# Patient Record
Sex: Female | Born: 1975 | Race: White | Hispanic: No | Marital: Single | Smoking: Never smoker
Health system: Southern US, Community
[De-identification: ages and names within clinical notes are randomized; demographics above are authoritative.]

## PROBLEM LIST (undated history)

## (undated) DIAGNOSIS — G4730 Sleep apnea, unspecified: Secondary | ICD-10-CM

## (undated) DIAGNOSIS — M549 Dorsalgia, unspecified: Secondary | ICD-10-CM

## (undated) DIAGNOSIS — G8929 Other chronic pain: Secondary | ICD-10-CM

## (undated) DIAGNOSIS — E669 Obesity, unspecified: Secondary | ICD-10-CM

## (undated) DIAGNOSIS — J45909 Unspecified asthma, uncomplicated: Secondary | ICD-10-CM

## (undated) DIAGNOSIS — O039 Complete or unspecified spontaneous abortion without complication: Secondary | ICD-10-CM

## (undated) DIAGNOSIS — G43909 Migraine, unspecified, not intractable, without status migrainosus: Secondary | ICD-10-CM

---

## 2016-05-01 ENCOUNTER — Encounter: Payer: Self-pay | Admitting: Emergency Medicine

## 2016-05-01 ENCOUNTER — Emergency Department
Admission: EM | Admit: 2016-05-01 | Discharge: 2016-05-01 | Disposition: A | Discharge status: Home / Self Care | Payer: Medicaid Other | Attending: Emergency Medicine | Admitting: Emergency Medicine

## 2016-05-01 ENCOUNTER — Emergency Department: Payer: Medicaid Other

## 2016-05-01 DIAGNOSIS — Z79891 Long term (current) use of opiate analgesic: Secondary | ICD-10-CM | POA: Insufficient documentation

## 2016-05-01 DIAGNOSIS — Y929 Unspecified place or not applicable: Secondary | ICD-10-CM | POA: Insufficient documentation

## 2016-05-01 DIAGNOSIS — W1839XA Other fall on same level, initial encounter: Secondary | ICD-10-CM | POA: Diagnosis not present

## 2016-05-01 DIAGNOSIS — Y93G3 Activity, cooking and baking: Secondary | ICD-10-CM | POA: Diagnosis not present

## 2016-05-01 DIAGNOSIS — S022XXA Fracture of nasal bones, initial encounter for closed fracture: Secondary | ICD-10-CM | POA: Insufficient documentation

## 2016-05-01 DIAGNOSIS — G43809 Other migraine, not intractable, without status migrainosus: Secondary | ICD-10-CM | POA: Diagnosis not present

## 2016-05-01 DIAGNOSIS — J45909 Unspecified asthma, uncomplicated: Secondary | ICD-10-CM | POA: Insufficient documentation

## 2016-05-01 DIAGNOSIS — Z791 Long term (current) use of non-steroidal anti-inflammatories (NSAID): Secondary | ICD-10-CM | POA: Diagnosis not present

## 2016-05-01 DIAGNOSIS — Z79899 Other long term (current) drug therapy: Secondary | ICD-10-CM | POA: Insufficient documentation

## 2016-05-01 DIAGNOSIS — R55 Syncope and collapse: Secondary | ICD-10-CM | POA: Diagnosis present

## 2016-05-01 DIAGNOSIS — Y999 Unspecified external cause status: Secondary | ICD-10-CM | POA: Insufficient documentation

## 2016-05-01 HISTORY — DX: Other chronic pain: G89.29

## 2016-05-01 HISTORY — DX: Unspecified asthma, uncomplicated: J45.909

## 2016-05-01 HISTORY — DX: Sleep apnea, unspecified: G47.30

## 2016-05-01 HISTORY — DX: Migraine, unspecified, not intractable, without status migrainosus: G43.909

## 2016-05-01 HISTORY — DX: Complete or unspecified spontaneous abortion without complication: O03.9

## 2016-05-01 HISTORY — DX: Dorsalgia, unspecified: M54.9

## 2016-05-01 LAB — URINALYSIS COMPLETE WITH MICROSCOPIC (ARMC ONLY)
BACTERIA UA: NONE SEEN
Bilirubin Urine: NEGATIVE
Glucose, UA: NEGATIVE mg/dL
HGB URINE DIPSTICK: NEGATIVE
Ketones, ur: NEGATIVE mg/dL
LEUKOCYTES UA: NEGATIVE
Nitrite: NEGATIVE
PH: 5 (ref 5.0–8.0)
PROTEIN: 30 mg/dL — AB
SPECIFIC GRAVITY, URINE: 1.029 (ref 1.005–1.030)

## 2016-05-01 LAB — CBC
HEMATOCRIT: 41 % (ref 35.0–47.0)
Hemoglobin: 14 g/dL (ref 12.0–16.0)
MCH: 28.3 pg (ref 26.0–34.0)
MCHC: 34.1 g/dL (ref 32.0–36.0)
MCV: 83.2 fL (ref 80.0–100.0)
Platelets: 363 10*3/uL (ref 150–440)
RBC: 4.93 MIL/uL (ref 3.80–5.20)
RDW: 13.5 % (ref 11.5–14.5)
WBC: 15.9 10*3/uL — AB (ref 3.6–11.0)

## 2016-05-01 LAB — BASIC METABOLIC PANEL
Anion gap: 8 (ref 5–15)
BUN: 10 mg/dL (ref 6–20)
CHLORIDE: 106 mmol/L (ref 101–111)
CO2: 25 mmol/L (ref 22–32)
Calcium: 8.9 mg/dL (ref 8.9–10.3)
Creatinine, Ser: 0.99 mg/dL (ref 0.44–1.00)
GFR calc non Af Amer: >60 mL/min (ref >60)
Glucose, Bld: 97 mg/dL (ref 65–99)
POTASSIUM: 3.7 mmol/L (ref 3.5–5.1)
SODIUM: 139 mmol/L (ref 135–145)

## 2016-05-01 LAB — TROPONIN I: Troponin I: <0.03 ng/mL (ref <0.03)

## 2016-05-01 LAB — POCT PREGNANCY, URINE: PREG TEST UR: NEGATIVE

## 2016-05-01 MED ORDER — BUTALBITAL-APAP-CAFFEINE 50-325-40 MG PO TABS
2.0000 | ORAL_TABLET | Freq: Once | ORAL | Status: AC
  Administered 2016-05-01: 2 via ORAL
  Filled 2016-05-01: qty 2

## 2016-05-01 MED ORDER — IPRATROPIUM-ALBUTEROL 0.5-2.5 (3) MG/3ML IN SOLN
3.0000 mL | Freq: Once | RESPIRATORY_TRACT | Status: AC
  Administered 2016-05-01: 3 mL via RESPIRATORY_TRACT
  Filled 2016-05-01: qty 3

## 2016-05-01 MED ORDER — DIPHENHYDRAMINE HCL 50 MG/ML IJ SOLN
25.0000 mg | Freq: Once | INTRAMUSCULAR | Status: AC
  Administered 2016-05-01: 25 mg via INTRAVENOUS
  Filled 2016-05-01: qty 1

## 2016-05-01 MED ORDER — METOCLOPRAMIDE HCL 5 MG/ML IJ SOLN
10.0000 mg | Freq: Once | INTRAMUSCULAR | Status: AC
  Administered 2016-05-01: 10 mg via INTRAVENOUS
  Filled 2016-05-01: qty 2

## 2016-05-01 MED ORDER — SODIUM CHLORIDE 0.9 % IV BOLUS (SEPSIS)
1000.0000 mL | Freq: Once | INTRAVENOUS | Status: AC
  Administered 2016-05-01: 1000 mL via INTRAVENOUS

## 2016-05-01 MED ORDER — BUTALBITAL-APAP-CAFFEINE 50-325-40 MG PO TABS: 1.0000 | ORAL_TABLET | Freq: Four times a day (QID) | ORAL | Status: AC | PRN

## 2016-05-01 MED ORDER — METHYLPREDNISOLONE SODIUM SUCC 125 MG IJ SOLR
125.0000 mg | Freq: Once | INTRAMUSCULAR | Status: AC
  Administered 2016-05-01: 125 mg via INTRAVENOUS
  Filled 2016-05-01: qty 2

## 2016-05-01 MED ORDER — KETOROLAC TROMETHAMINE 30 MG/ML IJ SOLN
30.0000 mg | Freq: Once | INTRAMUSCULAR | Status: AC
  Administered 2016-05-01: 30 mg via INTRAVENOUS
  Filled 2016-05-01: qty 1

## 2016-05-01 NOTE — ED Notes (Signed)
Unable to draw blood from IV site; flushes without difficulty

## 2016-05-01 NOTE — ED Provider Notes (Signed)
[facility] ____________________________________________  Time seen: Approximately 251 AM  I have reviewed the triage vital signs and the nursing notes.   HISTORY  Chief Complaint Migraine and Loss of Consciousness   HPI Jaime Mueller is a 40 y.o. female who comes into the hospital today with a syncopal episode. The patient reports that she was cooking dinner. She used her nebulizer and reports that she got up to cook and when she got up she passed out. The patient reports that she fell and hit her face on the floor. She is unsure exactly why she passed out. The patient has never passed out before. She reports that she's had a migraine earlier today. She has not been able to take her normal migraine medications or trying to tough it out. She reports that right now her head feels as though is going to explode.The patient rates her pain a 10 out of 10 in intensity. She reports that she's had migraines this bad in the past. The patient is sensitive to light and very nauseous. She reports that she did not eat anything today and she only drank a little bit. She reports she does have some blurred vision as well. The patient is here for evaluation.   Past Medical History  Diagnosis Date  . Asthma   . Sleep apnea   . Migraines   . Miscarriage   . Chronic back pain     There are no active problems to display for this patient.   Past Surgical History  Procedure Laterality Date  . Cesarean section      Current Outpatient Rx  Name  Route  Sig  Dispense  Refill  . albuterol (ACCUNEB) 1.25 MG/3ML nebulizer solution   Nebulization   Take 1 ampule by nebulization every 6 (six) hours as needed for wheezing.         Marland Kitchen albuterol (PROVENTIL HFA;VENTOLIN HFA) 108 (90 Base) MCG/ACT inhaler   Inhalation   Inhale 2 puffs into the lungs every 4 (four) hours as needed for wheezing or shortness of breath.         . gabapentin  (NEURONTIN) 100 MG capsule   Oral   Take 100 mg by mouth 3 (three) times daily.         Marland Kitchen ibuprofen (ADVIL,MOTRIN) 800 MG tablet   Oral   Take 800 mg by mouth every 8 (eight) hours as needed.         Marland Kitchen oxyCODONE-acetaminophen (PERCOCET/ROXICET) 5-325 MG tablet   Oral   Take 1 tablet by mouth every 4 (four) hours as needed for severe pain.         . butalbital-acetaminophen-caffeine (FIORICET) 50-325-40 MG tablet   Oral   Take 1-2 tablets by mouth every 6 (six) hours as needed for headache.   20 tablet   0     Allergies Penicillins  History reviewed. No pertinent family history.  Social History Social History  Substance Use Topics  . Smoking status: Never Smoker   . Smokeless tobacco: None  . Alcohol Use: No    Review of Systems Constitutional: No fever/chills Eyes: Blurred vision ENT: No sore throat. Cardiovascular: Denies chest pain. Respiratory:  shortness of breath. Gastrointestinal:  nausea, no vomiting.  No diarrhea.  No constipation. Genitourinary: Negative for dysuria. Musculoskeletal: Negative for back pain. Skin: Negative for rash. Neurological: Headache, syncope  10-point ROS otherwise negative.  ____________________________________________   PHYSICAL EXAM:  VITAL SIGNS: ED Triage  Vitals  Enc Vitals Group     BP 05/01/16 0033 135/85 mmHg     Pulse Rate 05/01/16 0033 86     Resp 05/01/16 0033 18     Temp 05/01/16 0033 97.9 F (36.6 C)     Temp Source 05/01/16 0033 Oral     SpO2 05/01/16 0033 97 %     Weight 05/01/16 0033 212 lb (96.163 kg)     Height 05/01/16 0033 4\' 11"  (1.499 m)     Head Cir --      Peak Flow --      Pain Score 05/01/16 0035 10     Pain Loc --      Pain Edu? --      Excl. in GC? --     Constitutional: Alert and oriented. Well appearing and in Moderate distress. Eyes: Conjunctivae are normal. PERRL. EOMI. Head: Atraumatic. Nose: No congestion/rhinnorhea. Mouth/Throat: Mucous membranes are moist.  Oropharynx  non-erythematous. Cardiovascular: Normal rate, regular rhythm. Grossly normal heart sounds.  Good peripheral circulation. Respiratory: Normal respiratory effort.  No retractions. Expiratory wheezes throughout all lung fields Gastrointestinal: Soft and nontender. No distention. Positive bowel sounds Musculoskeletal: No lower extremity tenderness nor edema.  Neurologic:  Normal speech and language. Radial nerves II through XII are grossly intact Skin:  Skin is warm, dry and intact. Psychiatric: Mood and affect are normal.   ____________________________________________   LABS (all labs ordered are listed, but only abnormal results are displayed)  Labs Reviewed  CBC - Abnormal; Notable for the following:    WBC 15.9 (*)    All other components within normal limits  URINALYSIS COMPLETEWITH MICROSCOPIC ([facility] ONLY) - Abnormal; Notable for the following:    Color, Urine YELLOW (*)    APPearance HAZY (*)    Protein, ur 30 (*)    Squamous Epithelial / LPF 6-30 (*)    All other components within normal limits  BASIC METABOLIC PANEL  TROPONIN I  CBG MONITORING, ED  POCT PREGNANCY, URINE   ____________________________________________  EKG  ED ECG REPORT I, Rebecka ApleyWebster,  Bonifacio Pruden P, the attending physician, personally viewed and interpreted this ECG.   Date: 05/01/2016  EKG Time: 0040  Rate: 83  Rhythm: normal sinus rhythm  Axis: normal  Intervals:none  ST&T Change: none  ____________________________________________  RADIOLOGY  CT head, cervical spine and maxillofacial: No acute intracranial pathology, no acute traumatic cervical spine pathology, fracture of the nasal bridge with mild deviation to the left. ____________________________________________   PROCEDURES  Procedure(s) performed: None  Procedures  Critical Care performed: No  ____________________________________________   INITIAL IMPRESSION / ASSESSMENT AND PLAN / ED COURSE  Pertinent labs & imaging results  that were available during my care of the patient were reviewed by me and considered in my medical decision making (see chart for details).  This is a 40 year old female who comes into the hospital today with syncope and a migraine. The patient reports that her head is been hurting and she is been unable to take her normal migraine medications. I will give the patient some Reglan, Benadryl, Toradol and a liter of normal saline. The patient is also wheezing significantly so I will give her 2 duo nebs and some Solu-Medrol. I will reassess the patient.  The patient's pain is improved to a 4. The patient's breathing is also improved. The patient will be discharged home. She'll receive a prescription for Fioricet and she should follow-up with her primary care physician. ____________________________________________   FINAL CLINICAL IMPRESSION(S) /  ED DIAGNOSES  Final diagnoses:  Other type of nonintractable migraine  Syncope, unspecified syncope type  Nasal fracture, closed, initial encounter      NEW MEDICATIONS STARTED DURING THIS VISIT:  Discharge Medication List as of 05/01/2016  6:52 AM    START taking these medications   Details  butalbital-acetaminophen-caffeine (FIORICET) 50-325-40 MG tablet Take 1-2 tablets by mouth every 6 (six) hours as needed for headache., Starting 05/01/2016, Until Tue 05/01/17, Print         Note:  This document was prepared using Dragon voice recognition software and may include unintentional dictation errors.    Rebecka Apley, MD 05/01/16 [phone]

## 2016-05-01 NOTE — ED Notes (Signed)
Discharge instructions reviewed with patient. Patient verbalized understanding. Patient ambulated to lobby without difficulty.   

## 2016-05-01 NOTE — Discharge Instructions (Signed)
Migraine Headache A migraine headache is an intense, throbbing pain on one or both sides of your head. A migraine can last for 30 minutes to several hours. CAUSES  The exact cause of a migraine headache is not always known. However, a migraine may be caused when nerves in the brain become irritated and release chemicals that cause inflammation. This causes pain. Certain things may also trigger migraines, such as:  Alcohol.  Smoking.  Stress.  Menstruation.  Aged cheeses.  Foods or drinks that contain nitrates, glutamate, aspartame, or tyramine.  Lack of sleep.  Chocolate.  Caffeine.  Hunger.  Physical exertion.  Fatigue.  Medicines used to treat chest pain (nitroglycerine), birth control pills, estrogen, and some blood pressure medicines. SIGNS AND SYMPTOMS  Pain on one or both sides of your head.  Pulsating or throbbing pain.  Severe pain that prevents daily activities.  Pain that is aggravated by any physical activity.  Nausea, vomiting, or both.  Dizziness.  Pain with exposure to bright lights, loud noises, or activity.  General sensitivity to bright lights, loud noises, or smells. Before you get a migraine, you may get warning signs that a migraine is coming (aura). An aura may include:  Seeing flashing lights.  Seeing bright spots, halos, or zigzag lines.  Having tunnel vision or blurred vision.  Having feelings of numbness or tingling.  Having trouble talking.  Having muscle weakness. DIAGNOSIS  A migraine headache is often diagnosed based on:  Symptoms.  Physical exam.  A CT scan or MRI of your head. These imaging tests cannot diagnose migraines, but they can help rule out other causes of headaches. TREATMENT Medicines may be given for pain and nausea. Medicines can also be given to help prevent recurrent migraines.  HOME CARE INSTRUCTIONS  Only take over-the-counter or prescription medicines for pain or discomfort as directed by your  health care provider. The use of long-term narcotics is not recommended.  Lie down in a dark, quiet room when you have a migraine.  Keep a journal to find out what may trigger your migraine headaches. For example, write down:  What you eat and drink.  How much sleep you get.  Any change to your diet or medicines.  Limit alcohol consumption.  Quit smoking if you smoke.  Get 7-9 hours of sleep, or as recommended by your health care provider.  Limit stress.  Keep lights dim if bright lights bother you and make your migraines worse. SEEK IMMEDIATE MEDICAL CARE IF:   Your migraine becomes severe.  You have a fever.  You have a stiff neck.  You have vision loss.  You have muscular weakness or loss of muscle control.  You start losing your balance or have trouble walking.  You feel faint or pass out.  You have severe symptoms that are different from your first symptoms. MAKE SURE YOU:   Understand these instructions.  Will watch your condition.  Will get help right away if you are not doing well or get worse.   This information is not intended to replace advice given to you by your health care provider. Make sure you discuss any questions you have with your health care provider.   Document Released: 10/09/2005 Document Revised: 10/30/2014 Document Reviewed: 06/16/2013 Elsevier Interactive Patient Education 2016 [employer]  Nasal Fracture A nasal fracture is a break or crack in the bones or cartilage of the nose. Minor breaks do not require treatment. These breaks usually heal on their own after about one  month. Serious breaks may require surgery. CAUSES This injury is usually caused by a blunt injury to the nose. This type of injury often occurs from:  Contact sports.  Car accidents.  Falls.  Getting punched. SYMPTOMS Symptoms of this injury include:  Pain.  Swelling of the nose.  Bleeding from the nose.  Bruising around the nose or eyes. This may  include having black eyes.  Crooked appearance of the nose. DIAGNOSIS This injury may be diagnosed with a physical exam. The health care provider will gently feel the nose for signs of broken bones. He or she will look inside the nostrils to make sure that there is not a blood-filled swelling on the dividing wall between the nostrils (septal hematoma). X-rays of the nose may not show a nasal fracture even when one is present. In some cases, X-rays or a CT scan may be done 1-5 days after the injury. Sometimes, the health care provider will want to wait until the swelling has gone down. TREATMENT Often, minor fractures that have caused no deformity do not require treatment. More serious fractures in which bones have moved out of position may require surgery, which will take place after the swelling is gone. Surgery will stabilize and align the fracture. In some cases, a health care provider may be able to reposition the bones without surgery. This may be done in the health care provider's office after medicine is given to numb the area (local anesthetic). HOME CARE INSTRUCTIONS  If directed, apply ice to the injured area:  Put ice in a plastic bag.  Place a towel between your skin and the bag.  Leave the ice on for 20 minutes, 2-3 times per day.  Take over-the-counter and prescription medicines only as told by your health care provider.  If your nose starts to bleed, sit in an upright position while you squeeze the soft parts of your nose against the dividing wall between your nostrils (septum) for 10 minutes.  Try to avoid blowing your nose.  Return to your normal activities as told by your health care provider. Ask your health care provider what activities are safe for you.  Avoid contact sports for 3-4 weeks or as told by your health care provider.  Keep all follow-up visits as told by your health care provider. This is important. SEEK MEDICAL CARE IF:  Your pain increases or becomes  severe.  You continue to have nosebleeds.  The shape of your nose does not return to normal within 5 days.  You have pus draining out of your nose. SEEK IMMEDIATE MEDICAL CARE IF:  You have bleeding from your nose that does not stop after you pinch your nostrils closed for 20 minutes and keep ice on your nose.  You have clear fluid draining out of your nose.  You notice a grape-like swelling on the septum. This swelling is a collection of blood (hematoma) that must be drained to help prevent infection.  You have difficulty moving your eyes.  You have repeated vomiting.   This information is not intended to replace advice given to you by your health care provider. Make sure you discuss any questions you have with your health care provider.   Document Released: 10/06/2000 Document Revised: 06/30/2015 Document Reviewed: 11/16/2014 Elsevier Interactive Patient Education 2016 [employer].  Syncope Syncope is a medical term for fainting or passing out. This means you lose consciousness and drop to the ground. People are generally unconscious for less than 5 minutes. You may  have some muscle twitches for up to 15 seconds before waking up and returning to normal. Syncope occurs more often in older adults, but it can happen to anyone. While most causes of syncope are not dangerous, syncope can be a sign of a serious medical problem. It is important to seek medical care.  CAUSES  Syncope is caused by a sudden drop in blood flow to the brain. The specific cause is often not determined. Factors that can bring on syncope include:  Taking medicines that lower blood pressure.  Sudden changes in posture, such as standing up quickly.  Taking more medicine than prescribed.  Standing in one place for too long.  Seizure disorders.  Dehydration and excessive exposure to heat.  Low blood sugar (hypoglycemia).  Straining to have a bowel movement.  Heart disease, irregular heartbeat, or other  circulatory problems.  Fear, emotional distress, seeing blood, or severe pain. SYMPTOMS  Right before fainting, you may:  Feel dizzy or light-headed.  Feel nauseous.  See all white or all black in your field of vision.  Have cold, clammy skin. DIAGNOSIS  Your health care provider will ask about your symptoms, perform a physical exam, and perform an electrocardiogram (ECG) to record the electrical activity of your heart. Your health care provider may also perform other heart or blood tests to determine the cause of your syncope which may include:  Transthoracic echocardiogram (TTE). During echocardiography, sound waves are used to evaluate how blood flows through your heart.  Transesophageal echocardiogram (TEE).  Cardiac monitoring. This allows your health care provider to monitor your heart rate and rhythm in real time.  Holter monitor. This is a portable device that records your heartbeat and can help diagnose heart arrhythmias. It allows your health care provider to track your heart activity for several days, if needed.  Stress tests by exercise or by giving medicine that makes the heart beat faster. TREATMENT  In most cases, no treatment is needed. Depending on the cause of your syncope, your health care provider may recommend changing or stopping some of your medicines. HOME CARE INSTRUCTIONS  Have someone stay with you until you feel stable.  Do not drive, use machinery, or play sports until your health care provider says it is okay.  Keep all follow-up appointments as directed by your health care provider.  Lie down right away if you start feeling like you might faint. Breathe deeply and steadily. Wait until all the symptoms have passed.  Drink enough fluids to keep your urine clear or pale yellow.  If you are taking blood pressure or heart medicine, get up slowly and take several minutes to sit and then stand. This can reduce dizziness. SEEK IMMEDIATE MEDICAL CARE IF:     You have a severe headache.  You have unusual pain in the chest, abdomen, or back.  You are bleeding from your mouth or rectum, or you have black or tarry stool.  You have an irregular or very fast heartbeat.  You have pain with breathing.  You have repeated fainting or seizure-like jerking during an episode.  You faint when sitting or lying down.  You have confusion.  You have trouble walking.  You have severe weakness.  You have vision problems. If you fainted, call your local emergency services (911 in [place] Do not drive yourself to the hospital.    This information is not intended to replace advice given to you by your health care provider. Make sure you discuss any questions you  have with your health care provider.   Document Released: 10/09/2005 Document Revised: 02/23/2015 Document Reviewed: 12/08/2011 Elsevier Interactive Patient Education [employer]

## 2016-05-01 NOTE — ED Notes (Addendum)
Pt says about 1 hour ago she was cooking when she became dizzy and passed out; hit her face on the oven; obvious deformity to nose; pt says her nose was bleeding heavily just after incident but no bleeding from nares at this time; pt keeps clearing her throat, says it feels like she is still having some bleeding down the back of her throat; "tastes like pennies"; pt c/o headache and face pain, rates 10/10; pt reports migraines for the last few days and a miscarriage several weeks ago; pt awake and alert at present;

## 2016-05-24 ENCOUNTER — Other Ambulatory Visit: Payer: Medicaid Other

## 2016-05-25 ENCOUNTER — Encounter: Admission: RE | Payer: Self-pay | Source: Ambulatory Visit

## 2016-05-25 ENCOUNTER — Ambulatory Visit: Admission: RE | Admit: 2016-05-25 | Payer: Medicaid Other | Source: Ambulatory Visit | Admitting: Otolaryngology

## 2016-05-25 SURGERY — CLOSED REDUCTION, FRACTURE, NASAL BONE
Anesthesia: Choice

## 2018-01-31 IMAGING — CT CT CERVICAL SPINE W/O CM
5 of 10 series · 10 of 33 positions shown, 11 images · non-contrast
Comparison: None.

CLINICAL DATA: 39-year-old female with syncope and deformity of the
nose.

EXAM:
CT HEAD WITHOUT CONTRAST
CT MAXILLOFACIAL WITHOUT CONTRAST
CT CERVICAL SPINE WITHOUT CONTRAST
TECHNIQUE: Multidetector CT imaging of the head, cervical spine, and
maxillofacial structures were performed using the standard protocol
without intravenous contrast. Multiplanar CT image reconstructions
of the cervical spine and maxillofacial structures were also
generated.

[Series 4: max soft · axial · 0.29mm/px · z∈[-198,-148]mm · 2 of 75 slices shown]
[im 25/75  soft-tissue]
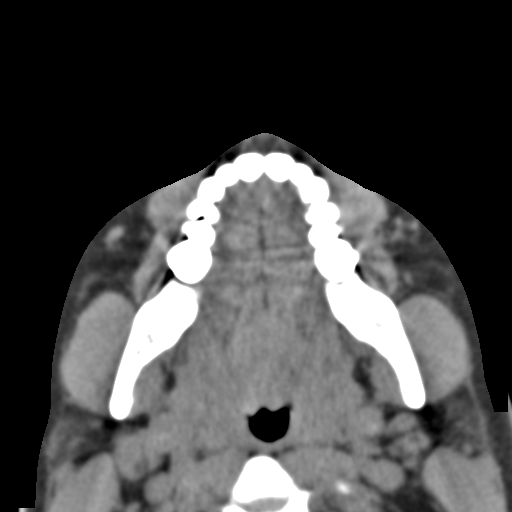
[im 50/75  soft-tissue]
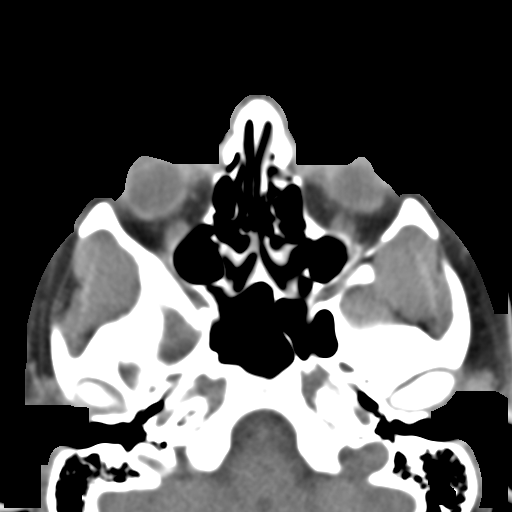

[Series 7: soft tissue · axial · 0.29mm/px · z∈[-262,-202]mm · 2 of 90 slices shown]
[im 30/90  soft-tissue]
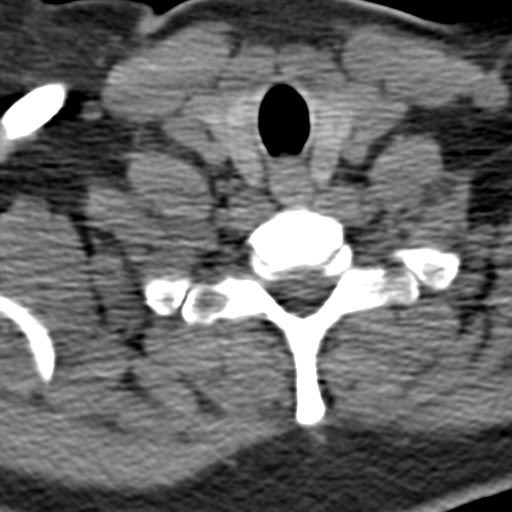
[im 60/90  soft-tissue]
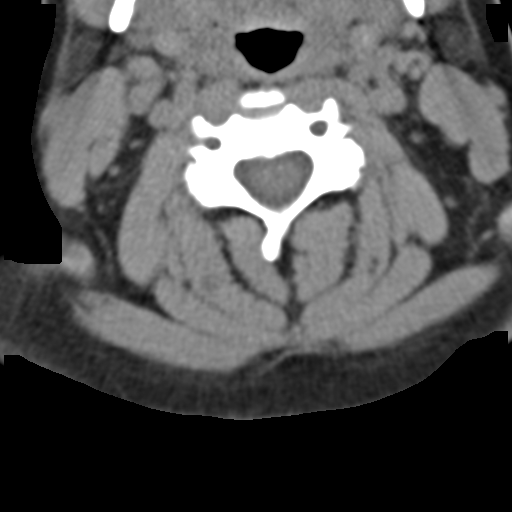

[Series 12: sagittal bone · sagittal · 0.25mm/px · 2 of 61 slices shown]
[im 21/61  bone]
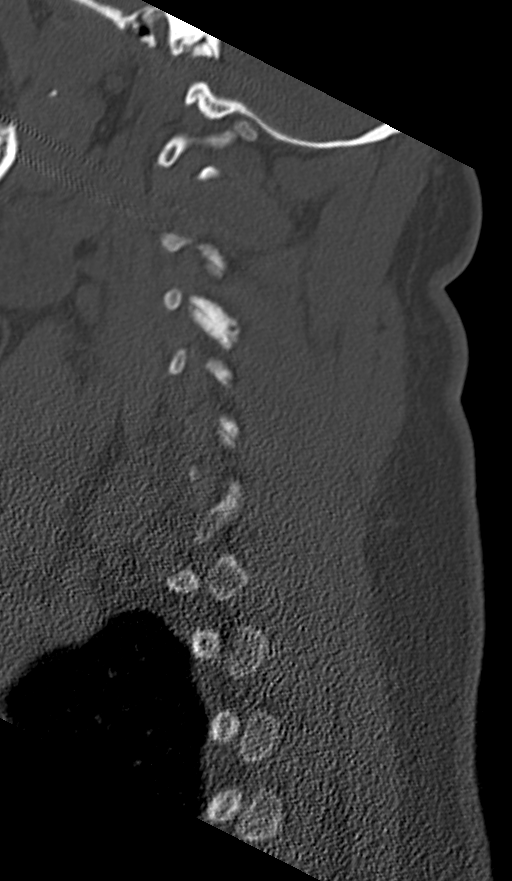
[im 41/61  bone]
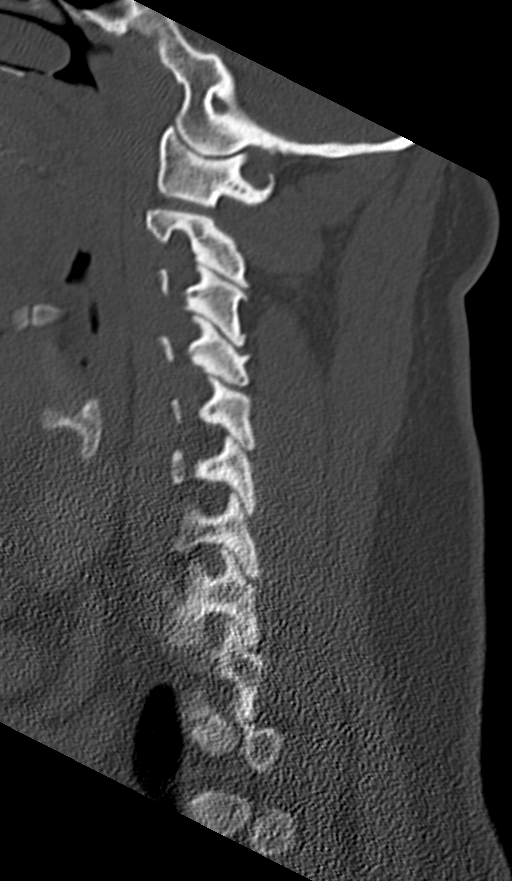

[Series 14: axial · axial · 0.27mm/px · z∈[-307,-216]mm · 3 of 102 slices shown, 4 images]
[im 26/102  soft-tissue]
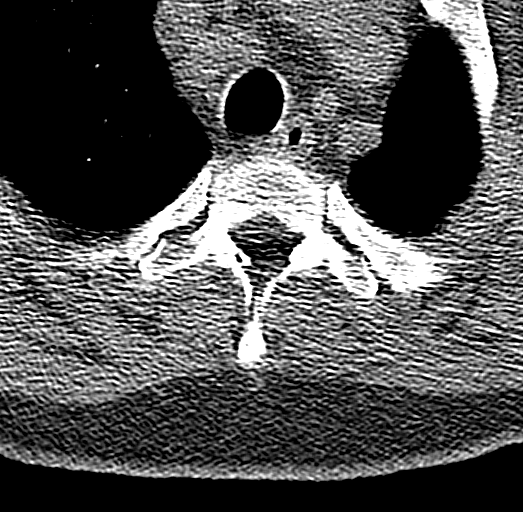
[im 26/102  bone]
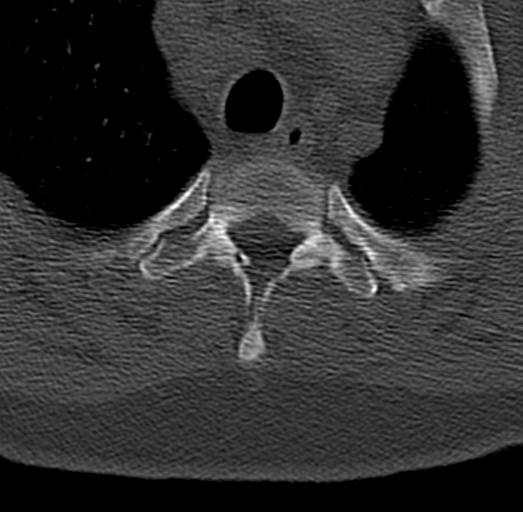
[im 51/102  bone]
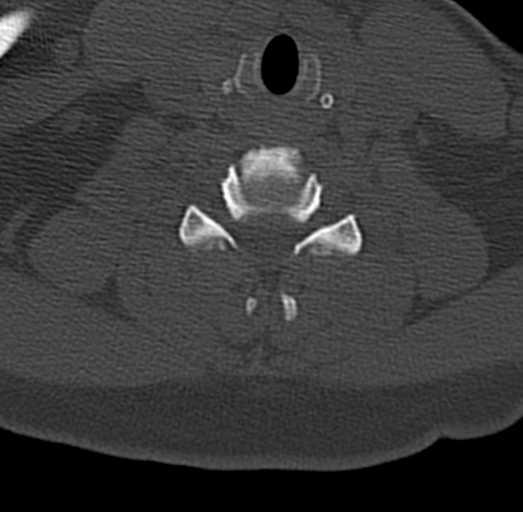
[im 76/102  bone]
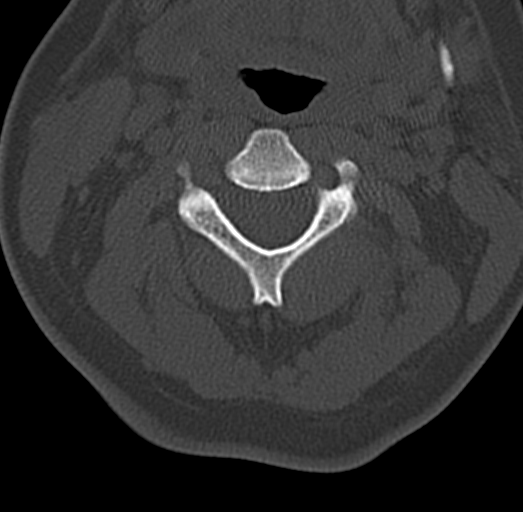

[Series 15: coronal soft · coronal · 0.29mm/px · 1 of 66 slices shown]
[im 33/66  bone]
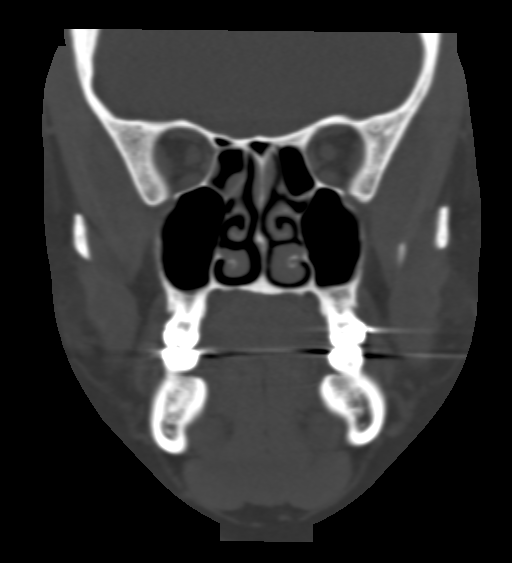

[10 of 33 positions shown; findings below may reference images not displayed]

FINDINGS: CT HEAD FINDINGS

The ventricles and the sulci are appropriate in size for the
patient's age. Small focal hypodensity in the right frontal
periventricular white matter new appears chronic. There is no
intracranial hemorrhage. No midline shift or mass effect identified.
The gray-white matter differentiation is preserved.

There is mild mucoperiosteal thickening of the paranasal sinuses. No
air-fluid level. The mastoid air cells are clear. The calvarium is
intact.

CT MAXILLOFACIAL FINDINGS

There is fracture of the nasal bridge mid deviation to the left. No
other acute fracture identified. The maxilla, mandible, and
pterygoid plates are intact. The globes, retro-orbital fat, and
orbital walls are preserved. There is mild mucoperiosteal thickening
of the paranasal sinuses. No air-fluid levels. There is mild soft
tissue swelling over the nose.

CT CERVICAL SPINE FINDINGS

There is no acute fracture or subluxation of the cervical
spine.There is mild multilevel degenerative changes.The odontoid and
spinous processes are intact.There is normal anatomic alignment of
the C1-C2 lateral masses. The visualized soft tissues appear
unremarkable.
IMPRESSION: No acute intracranial pathology.

No acute/ traumatic cervical spine pathology.

Fracture of the nasal bridge with mild deviation to the left.

## 2020-10-08 ENCOUNTER — Other Ambulatory Visit: Payer: Self-pay

## 2020-10-08 ENCOUNTER — Ambulatory Visit
Admission: EM | Admit: 2020-10-08 | Discharge: 2020-10-08 | Disposition: A | Discharge status: Home / Self Care | Payer: Medicaid Other | Attending: Family Medicine | Admitting: Family Medicine

## 2020-10-08 DIAGNOSIS — Z1152 Encounter for screening for COVID-19: Secondary | ICD-10-CM | POA: Diagnosis not present

## 2020-10-08 DIAGNOSIS — U071 COVID-19: Secondary | ICD-10-CM | POA: Insufficient documentation

## 2020-10-08 DIAGNOSIS — Z20822 Contact with and (suspected) exposure to covid-19: Secondary | ICD-10-CM | POA: Diagnosis present

## 2020-10-08 HISTORY — DX: Obesity, unspecified: E66.9

## 2020-10-08 NOTE — ED Triage Notes (Signed)
Pt is here wanting COVID testing after an exposure.  

## 2020-10-08 NOTE — Discharge Instructions (Signed)

## 2020-10-09 LAB — SARS CORONAVIRUS 2 (TAT 6-24 HRS): SARS Coronavirus 2: POSITIVE — AB

## 2021-08-25 ENCOUNTER — Ambulatory Visit
Admission: EM | Admit: 2021-08-25 | Discharge: 2021-08-25 | Disposition: A | Discharge status: Home / Self Care | Payer: Medicaid Other

## 2021-08-25 ENCOUNTER — Other Ambulatory Visit: Payer: Self-pay

## 2021-08-25 DIAGNOSIS — R102 Pelvic and perineal pain: Secondary | ICD-10-CM

## 2021-08-25 NOTE — ED Provider Notes (Signed)
[facility]    CSN: [id number] Arrival date & time: 08/25/21  1417      History   Chief Complaint Chief Complaint  Patient presents with   Abdominal Pain    HPI Jaime Mueller is a 45 y.o. female.   Patient presents today with vaginal and suprapubic pain.  Endorses that her Foley catheter that was placed postsurgical is falling out and that she is peeing through it and having blood leak through it.  Hysterectomy completed on 08/22/2021, has follow-up appointment on 10/04/2021 in office.  Patient notified her surgeon of concerns and was notified to go to local emergency department but patient came to urgent care.  Past Medical History:  Diagnosis Date   Asthma    Chronic back pain    Migraines    Miscarriage    Obesity    Sleep apnea     There are no problems to display for this patient.   Past Surgical History:  Procedure Laterality Date   CESAREAN SECTION      OB History   No obstetric history on file.      Home Medications    Prior to Admission medications   Medication Sig Start Date End Date Taking? Authorizing Provider  albuterol (ACCUNEB) 1.25 MG/3ML nebulizer solution Take 1 ampule by nebulization every 6 (six) hours as needed for wheezing.    [provider]  albuterol (PROVENTIL HFA;VENTOLIN HFA) 108 (90 Base) MCG/ACT inhaler Inhale 2 puffs into the lungs every 4 (four) hours as needed for wheezing or shortness of breath.    [provider]  amphetamine-dextroamphetamine (ADDERALL) 20 MG tablet Take 10 mg by mouth 2 (two) times daily. 07/30/20   [provider]  gabapentin (NEURONTIN) 100 MG capsule Take 100 mg by mouth 3 (three) times daily.    [provider]  ibuprofen (ADVIL,MOTRIN) 800 MG tablet Take 800 mg by mouth every 8 (eight) hours as needed for mild pain.     [provider]  oxyCODONE-acetaminophen (PERCOCET/ROXICET) 5-325 MG tablet Take 1 tablet by mouth every 4 (four) hours as  needed for severe pain.    [provider]    Family History No family history on file.  Social History Social History   Tobacco Use   Smoking status: Never   Smokeless tobacco: Never  Substance Use Topics   Alcohol use: No   Drug use: No     Allergies   Other and Penicillins   Review of Systems Review of Systems  Gastrointestinal:  Positive for abdominal pain.    Physical Exam Triage Vital Signs ED Triage Vitals  Enc Vitals Group     BP 08/25/21 1424 125/89     Pulse Rate 08/25/21 1424 84     Resp 08/25/21 1424 18     Temp 08/25/21 1424 98.2 F (36.8 C)     Temp Source 08/25/21 1424 Oral     SpO2 08/25/21 1424 99 %     Weight --      Height --      Head Circumference --      Peak Flow --      Pain Score 08/25/21 1425 10     Pain Loc --      Pain Edu? --      Excl. in GC? --    No data found.  Updated Vital Signs BP 125/89 (BP Location: Right Arm)   Pulse 84   Temp 98.2 F (36.8 C) (Oral)  Resp 18   SpO2 99%   Visual Acuity Right Eye Distance:   Left Eye Distance:   Bilateral Distance:    Right Eye Near:   Left Eye Near:    Bilateral Near:     Physical Exam   UC Treatments / Results  Labs (all labs ordered are listed, but only abnormal results are displayed) Labs Reviewed - No data to display  EKG   Radiology No results found.  Procedures Procedures (including critical care time)  Medications Ordered in UC Medications - No data to display  Initial Impression / Assessment and Plan / UC Course  I have reviewed the triage vital signs and the nursing notes.  Pertinent labs & imaging results that were available during my care of the patient were reviewed by me and considered in my medical decision making (see chart for details).  Suprapubic pain   Patient was advised to go to local emergency department for further evaluation of surgical complications, patient has chosen to go by private vehicle, vital signs stable at  this time. Final Clinical Impressions(s) / UC Diagnoses   Final diagnoses:  None   Discharge Instructions   None    ED Prescriptions   None    PDMP not reviewed this encounter.   Valinda Hoar, NP 08/25/21 1437

## 2021-08-25 NOTE — ED Notes (Signed)
Provider notified of patient. 

## 2021-08-25 NOTE — ED Triage Notes (Signed)
Pt presents today with c/o pain in vagina and suprapubic area. She reports having hysterectomy on Monday. She has a foley catheter in place (leg bag) with f/u appt for 10/04/21. She called surgeon and was told to come to local ER. Her fiance dropped her off here.

## 2021-08-25 NOTE — ED Notes (Signed)
Patient is being discharged from the Urgent Care and sent to the Emergency Department via private vehicle . Per A.White NP, patient is in need of higher level of care due to abdominal pain. Patient is aware and verbalizes understanding of plan of care.  Vitals:   08/25/21 1424  BP: 125/89  Pulse: 84  Resp: 18  Temp: 98.2 F (36.8 C)  SpO2: 99%

## 2024-01-09 ENCOUNTER — Encounter (HOSPITAL_BASED_OUTPATIENT_CLINIC_OR_DEPARTMENT_OTHER): Payer: Self-pay

## 2024-01-09 ENCOUNTER — Other Ambulatory Visit: Payer: Self-pay

## 2024-01-09 DIAGNOSIS — R519 Headache, unspecified: Secondary | ICD-10-CM | POA: Insufficient documentation

## 2024-01-09 DIAGNOSIS — R1031 Right lower quadrant pain: Secondary | ICD-10-CM | POA: Diagnosis present

## 2024-01-09 DIAGNOSIS — Y92410 Unspecified street and highway as the place of occurrence of the external cause: Secondary | ICD-10-CM | POA: Insufficient documentation
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
## 2024-01-10 ENCOUNTER — Emergency Department (HOSPITAL_BASED_OUTPATIENT_CLINIC_OR_DEPARTMENT_OTHER)

## 2024-01-10 ENCOUNTER — Emergency Department (HOSPITAL_BASED_OUTPATIENT_CLINIC_OR_DEPARTMENT_OTHER)
Admission: EM | Admit: 2024-01-10 | Discharge: 2024-01-10 | Disposition: A | Discharge status: Home / Self Care | Attending: Emergency Medicine | Admitting: Emergency Medicine

## 2024-01-10 DIAGNOSIS — R1031 Right lower quadrant pain: Secondary | ICD-10-CM

## 2024-01-10 DIAGNOSIS — R519 Headache, unspecified: Secondary | ICD-10-CM

## 2024-01-10 LAB — COMPREHENSIVE METABOLIC PANEL
ALT: 12 U/L (ref 0–44)
AST: 15 U/L (ref 15–41)
Albumin: 3.7 g/dL (ref 3.5–5.0)
Alkaline Phosphatase: 64 U/L (ref 38–126)
Anion gap: 4 — ABNORMAL LOW (ref 5–15)
BUN: 13 mg/dL (ref 6–20)
CO2: 26 mmol/L (ref 22–32)
Calcium: 8.5 mg/dL — ABNORMAL LOW (ref 8.9–10.3)
Chloride: 108 mmol/L (ref 98–111)
Creatinine, Ser: 0.77 mg/dL (ref 0.44–1.00)
GFR, Estimated: >60 mL/min (ref >60)
Glucose, Bld: 95 mg/dL (ref 70–99)
Potassium: 3.8 mmol/L (ref 3.5–5.1)
Sodium: 138 mmol/L (ref 135–145)
Total Bilirubin: 0.3 mg/dL (ref 0.0–1.2)
Total Protein: 5.8 g/dL — ABNORMAL LOW (ref 6.5–8.1)

## 2024-01-10 LAB — CBC WITH DIFFERENTIAL/PLATELET
Abs Immature Granulocytes: 0.02 10*3/uL (ref 0.00–0.07)
Basophils Absolute: 0 10*3/uL (ref 0.0–0.1)
Basophils Relative: 0 %
Eosinophils Absolute: 0.1 10*3/uL (ref 0.0–0.5)
Eosinophils Relative: 2 %
HCT: 33 % — ABNORMAL LOW (ref 36.0–46.0)
Hemoglobin: 11.1 g/dL — ABNORMAL LOW (ref 12.0–15.0)
Immature Granulocytes: 0 %
Lymphocytes Relative: 31 %
Lymphs Abs: 2.6 10*3/uL (ref 0.7–4.0)
MCH: 28.8 pg (ref 26.0–34.0)
MCHC: 33.6 g/dL (ref 30.0–36.0)
MCV: 85.7 fL (ref 80.0–100.0)
Monocytes Absolute: 0.8 10*3/uL (ref 0.1–1.0)
Monocytes Relative: 10 %
Neutro Abs: 4.8 10*3/uL (ref 1.7–7.7)
Neutrophils Relative %: 57 %
Platelets: 270 10*3/uL (ref 150–400)
RBC: 3.85 MIL/uL — ABNORMAL LOW (ref 3.87–5.11)
RDW: 12.9 % (ref 11.5–15.5)
WBC: 8.4 10*3/uL (ref 4.0–10.5)
nRBC: 0 % (ref 0.0–0.2)

## 2024-01-10 LAB — LIPASE, BLOOD: Lipase: 27 U/L (ref 11–51)

## 2024-01-10 LAB — PREGNANCY, URINE: Preg Test, Ur: NEGATIVE

## 2024-01-10 MED ORDER — IOHEXOL 300 MG/ML  SOLN
100.0000 mL | Freq: Once | INTRAMUSCULAR | Status: AC | PRN
  Administered 2024-01-10: 80 mL via INTRAVENOUS

## 2024-01-10 MED ORDER — HYDROMORPHONE HCL 1 MG/ML IJ SOLN
0.5000 mg | Freq: Once | INTRAMUSCULAR | Status: AC
  Administered 2024-01-10: 0.5 mg via INTRAVENOUS
  Filled 2024-01-10: qty 1
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
## 2024-01-11 ENCOUNTER — Encounter (HOSPITAL_BASED_OUTPATIENT_CLINIC_OR_DEPARTMENT_OTHER): Payer: Self-pay

## 2024-01-11 ENCOUNTER — Other Ambulatory Visit: Payer: Self-pay

## 2024-01-11 ENCOUNTER — Emergency Department (HOSPITAL_BASED_OUTPATIENT_CLINIC_OR_DEPARTMENT_OTHER)
Admission: EM | Admit: 2024-01-11 | Discharge: 2024-01-12 | Disposition: A | Discharge status: Home / Self Care | Attending: Emergency Medicine | Admitting: Emergency Medicine

## 2024-01-11 DIAGNOSIS — R109 Unspecified abdominal pain: Secondary | ICD-10-CM | POA: Diagnosis present

## 2024-01-11 DIAGNOSIS — N83201 Unspecified ovarian cyst, right side: Secondary | ICD-10-CM

## 2024-01-11 DIAGNOSIS — R102 Pelvic and perineal pain: Secondary | ICD-10-CM

## 2024-01-11 DIAGNOSIS — J45909 Unspecified asthma, uncomplicated: Secondary | ICD-10-CM | POA: Insufficient documentation

## 2024-01-11 LAB — URINALYSIS, ROUTINE W REFLEX MICROSCOPIC
Bilirubin Urine: NEGATIVE
Glucose, UA: NEGATIVE mg/dL
Ketones, ur: NEGATIVE mg/dL
Nitrite: NEGATIVE
Protein, ur: 30 mg/dL — AB
Specific Gravity, Urine: 1.044 — ABNORMAL HIGH (ref 1.005–1.030)
Squamous Epithelial / HPF: >50 /HPF (ref 0–5)
Trans Epithel, UA: 1
pH: 6 (ref 5.0–8.0)

## 2024-01-11 LAB — CBC
HCT: 32.8 % — ABNORMAL LOW (ref 36.0–46.0)
Hemoglobin: 11 g/dL — ABNORMAL LOW (ref 12.0–15.0)
MCH: 28.5 pg (ref 26.0–34.0)
MCHC: 33.5 g/dL (ref 30.0–36.0)
MCV: 85 fL (ref 80.0–100.0)
Platelets: 248 10*3/uL (ref 150–400)
RBC: 3.86 MIL/uL — ABNORMAL LOW (ref 3.87–5.11)
RDW: 12.9 % (ref 11.5–15.5)
WBC: 7.2 10*3/uL (ref 4.0–10.5)
nRBC: 0 % (ref 0.0–0.2)

## 2024-01-11 LAB — COMPREHENSIVE METABOLIC PANEL
ALT: 9 U/L (ref 0–44)
AST: 13 U/L — ABNORMAL LOW (ref 15–41)
Albumin: 3.9 g/dL (ref 3.5–5.0)
Alkaline Phosphatase: 78 U/L (ref 38–126)
Anion gap: 7 (ref 5–15)
BUN: 17 mg/dL (ref 6–20)
CO2: 27 mmol/L (ref 22–32)
Calcium: 8.7 mg/dL — ABNORMAL LOW (ref 8.9–10.3)
Chloride: 106 mmol/L (ref 98–111)
Creatinine, Ser: 0.73 mg/dL (ref 0.44–1.00)
GFR, Estimated: >60 mL/min (ref >60)
Glucose, Bld: 75 mg/dL (ref 70–99)
Potassium: 3.6 mmol/L (ref 3.5–5.1)
Sodium: 140 mmol/L (ref 135–145)
Total Bilirubin: 0.7 mg/dL (ref 0.0–1.2)
Total Protein: 6.3 g/dL — ABNORMAL LOW (ref 6.5–8.1)

## 2024-01-11 LAB — LIPASE, BLOOD: Lipase: 14 U/L (ref 11–51)

## 2024-01-11 LAB — PREGNANCY, URINE: Preg Test, Ur: NEGATIVE

## 2024-01-11 MED ORDER — ONDANSETRON HCL 4 MG/2ML IJ SOLN
4.0000 mg | Freq: Once | INTRAMUSCULAR | Status: AC
  Administered 2024-01-11: 4 mg via INTRAVENOUS
  Filled 2024-01-11: qty 2

## 2024-01-11 MED ORDER — MORPHINE SULFATE (PF) 4 MG/ML IV SOLN
4.0000 mg | Freq: Once | INTRAVENOUS | Status: AC
  Administered 2024-01-11: 4 mg via INTRAVENOUS
  Filled 2024-01-11: qty 1
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
## 2024-01-12 ENCOUNTER — Emergency Department (HOSPITAL_COMMUNITY)

## 2024-01-12 DIAGNOSIS — R109 Unspecified abdominal pain: Secondary | ICD-10-CM | POA: Diagnosis present

## 2024-01-12 DIAGNOSIS — N83201 Unspecified ovarian cyst, right side: Secondary | ICD-10-CM | POA: Diagnosis not present

## 2024-01-12 DIAGNOSIS — J45909 Unspecified asthma, uncomplicated: Secondary | ICD-10-CM | POA: Diagnosis not present

## 2024-01-12 LAB — WET PREP, GENITAL
Sperm: NONE SEEN
Trich, Wet Prep: NONE SEEN
WBC, Wet Prep HPF POC: <10 (ref <10)
Yeast Wet Prep HPF POC: NONE SEEN

## 2024-01-12 LAB — HIV ANTIBODY (ROUTINE TESTING W REFLEX): HIV Screen 4th Generation wRfx: NONREACTIVE

## 2024-01-12 MED ORDER — MORPHINE SULFATE (PF) 4 MG/ML IV SOLN
4.0000 mg | Freq: Once | INTRAVENOUS | Status: AC
  Administered 2024-01-12: 4 mg via INTRAVENOUS
  Filled 2024-01-12: qty 1

## 2024-01-12 MED ORDER — FENTANYL CITRATE PF 50 MCG/ML IJ SOSY
100.0000 ug | PREFILLED_SYRINGE | Freq: Once | INTRAMUSCULAR | Status: AC
  Administered 2024-01-12: 100 ug via INTRAVENOUS
  Filled 2024-01-12: qty 2

## 2024-01-12 MED ORDER — KETOROLAC TROMETHAMINE 15 MG/ML IJ SOLN
15.0000 mg | Freq: Once | INTRAMUSCULAR | Status: AC
  Administered 2024-01-12: 15 mg via INTRAVENOUS
  Filled 2024-01-12: qty 1
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
## 2024-01-14 ENCOUNTER — Ambulatory Visit (HOSPITAL_BASED_OUTPATIENT_CLINIC_OR_DEPARTMENT_OTHER)

## 2024-01-14 LAB — GC/CHLAMYDIA PROBE AMP (~~LOC~~) NOT AT ARMC
Chlamydia: NEGATIVE
Comment: NEGATIVE
Comment: NORMAL
Neisseria Gonorrhea: NEGATIVE
